# Patient Record
Sex: Male | Born: 1985 | Race: White | Hispanic: No | Marital: Single | Smoking: Current every day smoker
Health system: Southern US, Community
[De-identification: ages and names within clinical notes are randomized; demographics above are authoritative.]

## PROBLEM LIST (undated history)

## (undated) DIAGNOSIS — F909 Attention-deficit hyperactivity disorder, unspecified type: Secondary | ICD-10-CM

## (undated) DIAGNOSIS — A6000 Herpesviral infection of urogenital system, unspecified: Secondary | ICD-10-CM

## (undated) HISTORY — DX: Attention-deficit hyperactivity disorder, unspecified type: F90.9

## (undated) HISTORY — DX: Herpesviral infection of urogenital system, unspecified: A60.00

---
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
## 2005-09-19 ENCOUNTER — Ambulatory Visit: Payer: Self-pay | Admitting: Family Medicine

## 2006-11-23 ENCOUNTER — Ambulatory Visit: Payer: Self-pay | Admitting: Family Medicine

## 2009-12-13 ENCOUNTER — Ambulatory Visit: Payer: Self-pay | Admitting: Family Medicine
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
## 2011-03-06 ENCOUNTER — Encounter: Payer: Self-pay | Admitting: Internal Medicine

## 2011-03-07 ENCOUNTER — Encounter: Payer: Self-pay | Admitting: Family Medicine

## 2011-03-07 ENCOUNTER — Ambulatory Visit (INDEPENDENT_AMBULATORY_CARE_PROVIDER_SITE_OTHER): Payer: 59 | Admitting: Family Medicine

## 2011-03-07 VITALS — BP 130/90 | HR 71 | Wt 161.0 lb

## 2011-03-07 DIAGNOSIS — J309 Allergic rhinitis, unspecified: Secondary | ICD-10-CM

## 2011-03-07 DIAGNOSIS — Z23 Encounter for immunization: Secondary | ICD-10-CM

## 2011-03-07 DIAGNOSIS — M25579 Pain in unspecified ankle and joints of unspecified foot: Secondary | ICD-10-CM

## 2011-03-07 DIAGNOSIS — M25571 Pain in right ankle and joints of right foot: Secondary | ICD-10-CM

## 2011-03-07 MED ORDER — MOMETASONE FUROATE 50 MCG/ACT NA SUSP: 2.0000 | Freq: Every day | NASAL | Status: DC

## 2011-03-07 NOTE — Progress Notes (Signed)
  Subjective:    Patient ID: Sergio Marshall, male    DOB: 27-May-1985, 27 y.o.   MRN: [id number]  HPI He has a history of chronic ankle pain. Here is having difficulty with his right ankle in high school but does not remember any particular injury. Now he is standing for long periods of time and is noting lateral ankle discomfort. He also continues to have difficulty with his allergies mainly with nasal congestion and minimally with sneezing, itchy watery eyes, rhinorrhea. He states the Flonase has not been useful. He has also tried Zyrtec with minimal relief. He does smoke.   Review of Systems     Objective:   Physical Exam Alert and in no distress. Right foot exam shows slight laxity with anterior drawer testing. Good strength. Slight tenderness over the ATF area. X-ray shows no acute changes       Assessment & Plan:   1. Right ankle pain  DG Ankle Complete Right  2. Allergic rhinitis, mild     I will switch him to Nasonex. Will also have him do ankle strengthening exercises for the next several weeks and see how that does help his symptoms. If no improvement, he is to call me.

## 2011-03-07 NOTE — Patient Instructions (Signed)
Try the nasal spray and if that doesn't work, call me. I will call you with the results of the x-ray.

## 2012-07-10 ENCOUNTER — Encounter (HOSPITAL_COMMUNITY): Payer: Self-pay

## 2012-07-10 ENCOUNTER — Emergency Department (HOSPITAL_COMMUNITY): Payer: BC Managed Care – PPO

## 2012-07-10 ENCOUNTER — Emergency Department (HOSPITAL_COMMUNITY)
Admission: EM | Admit: 2012-07-10 | Discharge: 2012-07-10 | Disposition: A | Discharge status: Home / Self Care | Payer: BC Managed Care – PPO | Attending: Emergency Medicine | Admitting: Emergency Medicine

## 2012-07-10 DIAGNOSIS — Z8619 Personal history of other infectious and parasitic diseases: Secondary | ICD-10-CM | POA: Insufficient documentation

## 2012-07-10 DIAGNOSIS — Y939 Activity, unspecified: Secondary | ICD-10-CM | POA: Insufficient documentation

## 2012-07-10 DIAGNOSIS — F17200 Nicotine dependence, unspecified, uncomplicated: Secondary | ICD-10-CM | POA: Insufficient documentation

## 2012-07-10 DIAGNOSIS — X500XXA Overexertion from strenuous movement or load, initial encounter: Secondary | ICD-10-CM | POA: Insufficient documentation

## 2012-07-10 DIAGNOSIS — S93409A Sprain of unspecified ligament of unspecified ankle, initial encounter: Secondary | ICD-10-CM | POA: Insufficient documentation

## 2012-07-10 DIAGNOSIS — Y929 Unspecified place or not applicable: Secondary | ICD-10-CM | POA: Insufficient documentation

## 2012-07-10 DIAGNOSIS — S93402A Sprain of unspecified ligament of left ankle, initial encounter: Secondary | ICD-10-CM

## 2012-07-10 DIAGNOSIS — Z8659 Personal history of other mental and behavioral disorders: Secondary | ICD-10-CM | POA: Insufficient documentation

## 2012-07-10 MED ORDER — HYDROCODONE-ACETAMINOPHEN 5-325 MG PO TABS: 2.0000 | ORAL_TABLET | Freq: Four times a day (QID) | ORAL | Status: DC | PRN

## 2012-07-10 NOTE — ED Notes (Signed)
Patient was stepping off deck on the ground and stepped off wrong and rolled ankle. Iced ankle. Woke up this morning and unable to walk on it.

## 2012-07-10 NOTE — ED Provider Notes (Signed)
History     CSN: [id number]  Arrival date & time 07/10/12  9147   First MD Initiated Contact with Patient 07/10/12 [phone]      No chief complaint on file.   (Consider location/radiation/quality/duration/timing/severity/associated sxs/prior treatment) Patient is a 27 y.o. male presenting with ankle pain. The history is provided by the patient.  Ankle Pain Location:  Ankle Injury: yes   Mechanism of injury comment:  Stepped awkwardly off the deck and turned ankle. Ankle location:  L ankle Pain details:    Quality:  Sharp   Radiates to:  Does not radiate   Severity:  Moderate   Onset quality:  Sudden   Timing:  Constant   Progression:  Unchanged Chronicity:  New Prior injury to area:  No Relieved by:  NSAIDs Worsened by:  Activity and bearing weight   Past Medical History  Diagnosis Date  . ADHD (attention deficit hyperactivity disorder)   . Herpes genitalis     No past surgical history on file.  No family history on file.  History  Substance Use Topics  . Smoking status: Current Every Day Smoker  . Smokeless tobacco: Not on file  . Alcohol Use: Not on file      Review of Systems  All other systems reviewed and are negative.    Allergies  Review of patient's allergies indicates no known allergies.  Home Medications  No current outpatient prescriptions on file.  BP 133/69  Pulse 94  Temp(Src) 98 F (36.7 C) (Oral)  Resp 18  Ht 5\' 8"  (1.727 m)  Wt 161 lb (73.029 kg)  BMI 24.49 kg/m2  SpO2 99%  Physical Exam  Nursing note and vitals reviewed. Constitutional: He is oriented to person, place, and time. He appears well-developed and well-nourished. No distress.  HENT:  Head: Normocephalic and atraumatic.  Mouth/Throat: Oropharynx is clear and moist.  Neck: Normal range of motion. Neck supple.  Musculoskeletal:  The left ankle is noted to have swelling and slight ecchymosis over the lateral malleolus.  There is no 5th mt ttp or proximal fibular ttp.   Neurological: He is alert and oriented to person, place, and time.  Skin: Skin is warm and dry. He is not diaphoretic.    ED Course  Procedures (including critical care time)  Labs Reviewed - No data to display No results found.   No diagnosis found.    MDM  The xrays are negative.  Will treat as a sprain with rest, ice, elevation.  Follow up prn if not improving.        Geoffery Lyons, MD 07/10/12 [phone]

## 2013-02-28 ENCOUNTER — Encounter: Payer: Self-pay | Admitting: Family Medicine

## 2013-02-28 ENCOUNTER — Ambulatory Visit (INDEPENDENT_AMBULATORY_CARE_PROVIDER_SITE_OTHER): Payer: BC Managed Care – PPO | Admitting: Family Medicine

## 2013-02-28 VITALS — BP 130/87 | HR 87 | Temp 98.4°F | Ht 67.0 in | Wt 169.0 lb

## 2013-02-28 DIAGNOSIS — J069 Acute upper respiratory infection, unspecified: Secondary | ICD-10-CM

## 2013-02-28 DIAGNOSIS — B079 Viral wart, unspecified: Secondary | ICD-10-CM

## 2013-02-28 NOTE — Progress Notes (Signed)
   Subjective:    Patient ID: Sergio Marshall, male    DOB: 02-06-1986, 27 y.o.   MRN: [id number]  HPI He complains of a 4 day history started with nasal congestion,, slight sore throat and coughing no fever, chills or earache. He states he is feeling slightly better today. He also has a wart present on his left medial forearm. This does cause difficulty occasionally getting caught and bleeding.  Review of Systems     Objective:   Physical Exam alert and in no distress. Tympanic membranes and canals are normal. Throat is clear. Tonsils are normal. Neck is supple without adenopathy or thyromegaly. Cardiac exam shows a regular sinus rhythm without murmurs or gallops. Lungs are clear to auscultation. 1 cm raised lesion is noted on the left medial forearm.       Assessment & Plan:  Acute URI  Wart  supportive care for the URI since he is essentially getting better. The wart was frozen with verruca freeze. He will return here if further difficulty with the wart.

## 2014-02-11 IMAGING — CR DG ANKLE COMPLETE 3+V*L*
3 series · 3 of 3 positions shown · non-contrast
Comparison: None.

CLINICAL DATA: Fall with ankle twisting injury.

LEFT ANKLE COMPLETE - 3+ VIEW

[x ankle ap left]
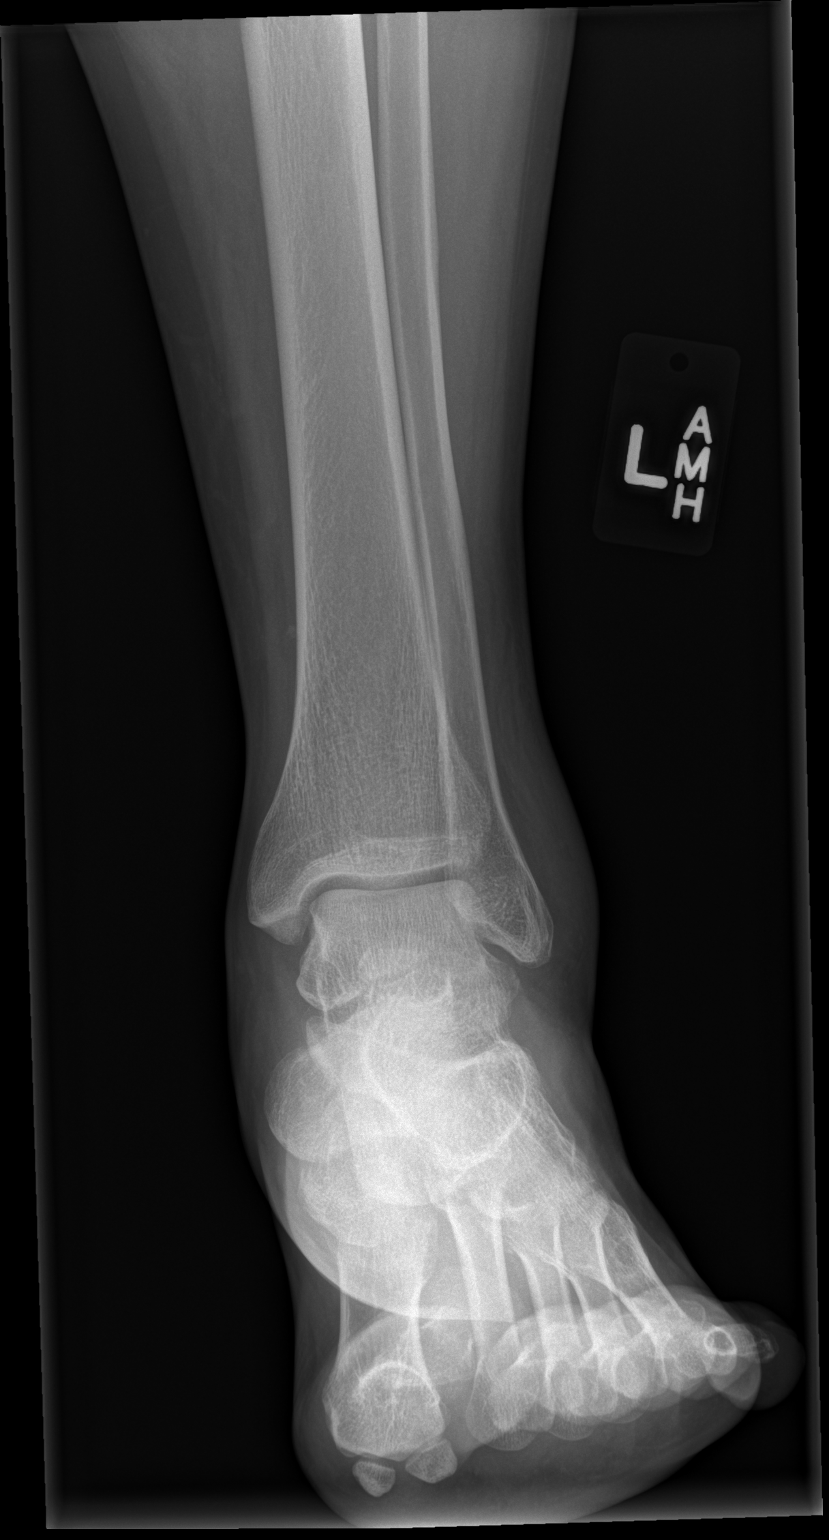

[x ankle obl left]
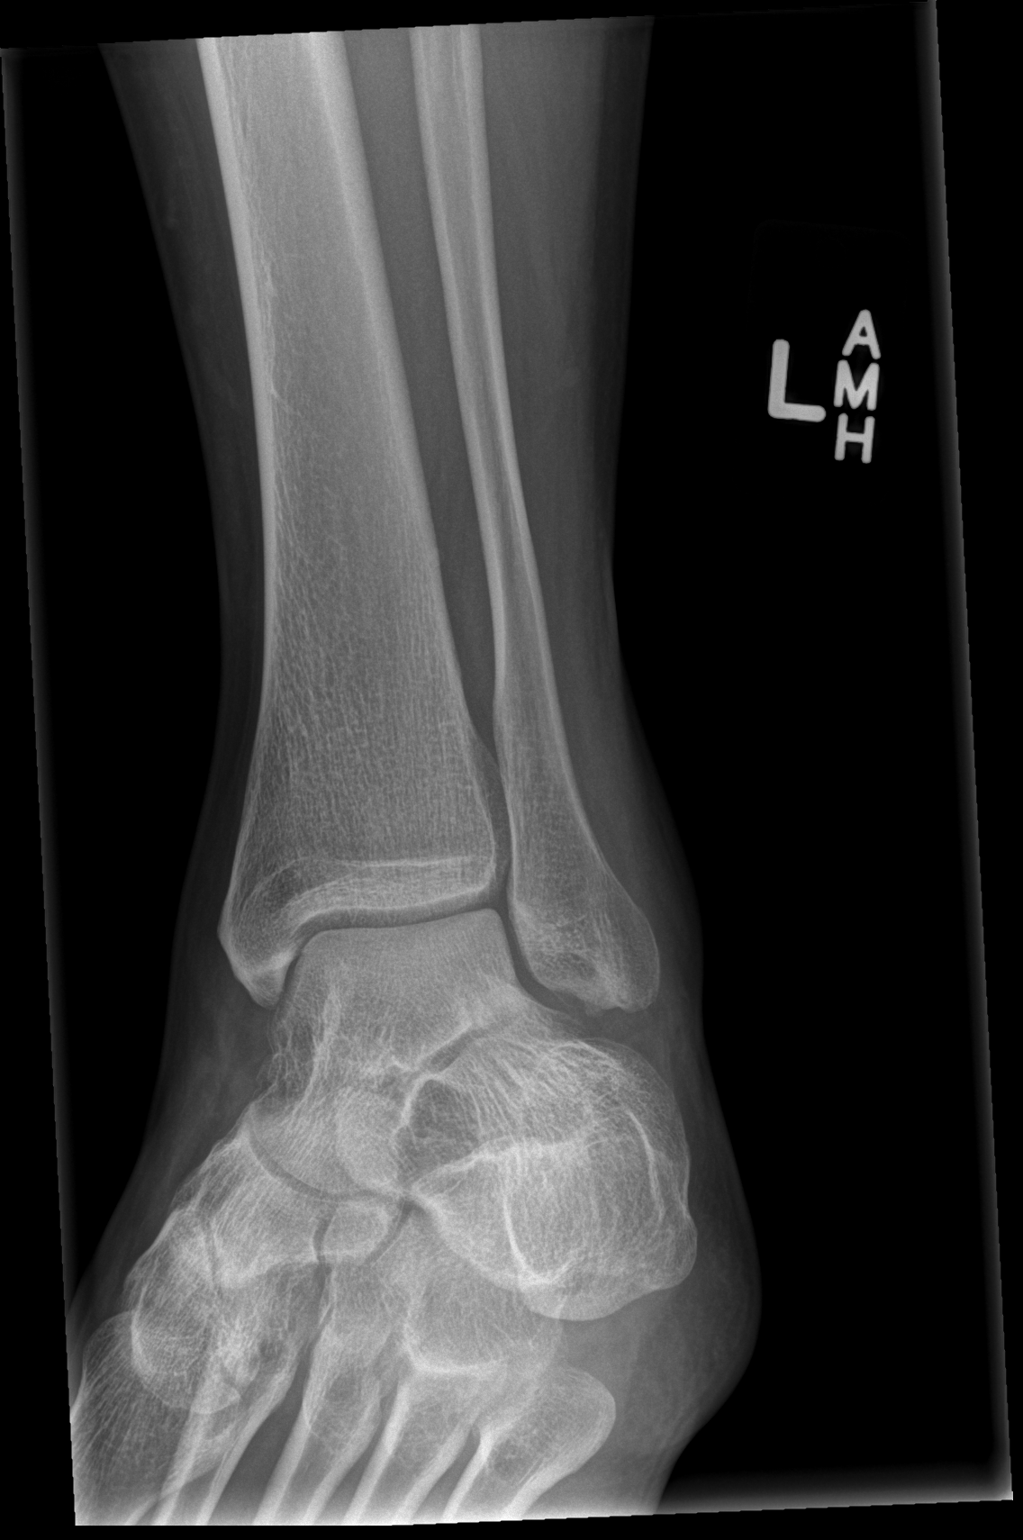

[x ankle lat left]
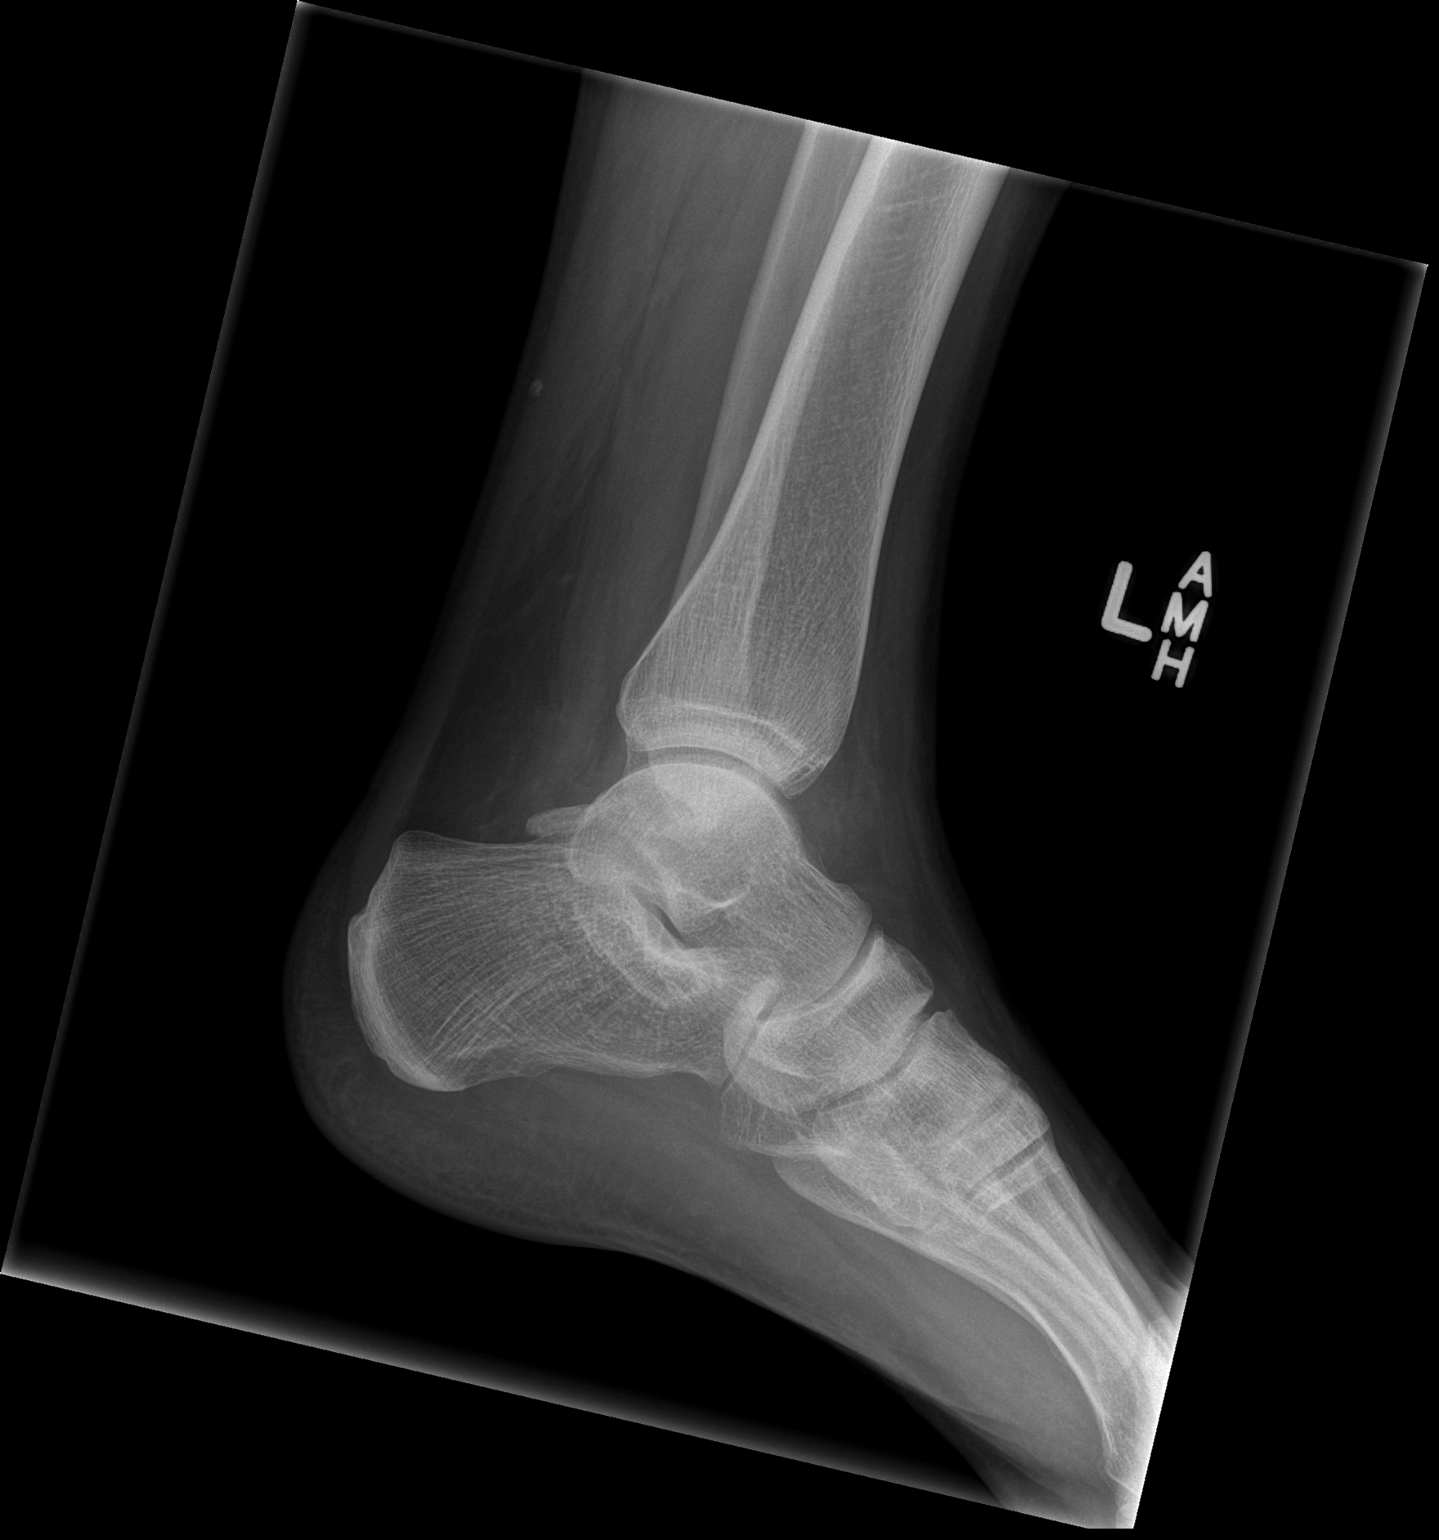

[3 of 3 positions shown; findings below may reference images not displayed]

FINDINGS: Abnormal soft tissue swelling overlies the lateral
malleolus.  No malleolar fracture observed.  Plafond and talar dome
intact.  Base of fifth metatarsal intact.

On the lateral projection there is a suggestion of a small
tibiotalar joint effusion.
IMPRESSION: 1.  Soft tissue swelling laterally and small tibiotalar joint
effusion.  An underlying fracture is not observed.

## 2014-09-19 ENCOUNTER — Ambulatory Visit (INDEPENDENT_AMBULATORY_CARE_PROVIDER_SITE_OTHER): Payer: Managed Care, Other (non HMO) | Admitting: Family Medicine

## 2014-09-19 ENCOUNTER — Encounter: Payer: Self-pay | Admitting: Family Medicine

## 2014-09-19 VITALS — BP 126/88 | HR 86 | Wt 173.0 lb

## 2014-09-19 DIAGNOSIS — F329 Major depressive disorder, single episode, unspecified: Secondary | ICD-10-CM

## 2014-09-19 DIAGNOSIS — F909 Attention-deficit hyperactivity disorder, unspecified type: Secondary | ICD-10-CM | POA: Diagnosis not present

## 2014-09-19 DIAGNOSIS — F32A Depression, unspecified: Secondary | ICD-10-CM

## 2014-09-19 DIAGNOSIS — F988 Other specified behavioral and emotional disorders with onset usually occurring in childhood and adolescence: Secondary | ICD-10-CM

## 2014-09-19 MED ORDER — BUPROPION HCL ER (SR) 150 MG PO TB12: 150.0000 mg | ORAL_TABLET | Freq: Two times a day (BID) | ORAL | Status: DC
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
## 2014-09-19 NOTE — Patient Instructions (Signed)
Call family and Children's services. [employer]. Check with [employer] [facility]. Start on the Wellbutrin in the morning for the first several days then twice a day. If there is trouble with sleeping let me know

## 2014-10-03 ENCOUNTER — Encounter: Payer: Self-pay | Admitting: Family Medicine

## 2014-10-03 ENCOUNTER — Ambulatory Visit (INDEPENDENT_AMBULATORY_CARE_PROVIDER_SITE_OTHER): Payer: Managed Care, Other (non HMO) | Admitting: Family Medicine

## 2014-10-03 VITALS — BP 118/78 | HR 70 | Wt 173.0 lb

## 2014-10-03 DIAGNOSIS — F32A Depression, unspecified: Secondary | ICD-10-CM

## 2014-10-03 DIAGNOSIS — F329 Major depressive disorder, single episode, unspecified: Secondary | ICD-10-CM

## 2014-10-03 MED ORDER — BUPROPION HCL ER (SR) 150 MG PO TB12
150.0000 mg | ORAL_TABLET | Freq: Two times a day (BID) | ORAL | Status: AC
Start: 2014-10-03 — End: ?

## 2014-10-03 NOTE — Patient Instructions (Addendum)
Write down your thoughts and get them out of  your mind. Take both medications in the morning. Limit your alcohol intake and don't use it to relax before you go to bed Call me in one month

## 2014-10-03 NOTE — Progress Notes (Signed)
   Subjective:    Patient ID: Sergio Marshall, male    DOB: 03/10/1986, 29 y.o.   MRN: [id number]  HPI He is here for recheck. He states he is doing much better overall but still having some difficulty with sleep and anxiety. He states that during the day he does tend to think a lot.   Review of Systems     Objective:   Physical Exam Alert and in no distress. Appropriate lead dressed with a good demeanor.       Assessment & Plan:  Depression - Plan: buPROPion (WELLBUTRIN SR) 150 MG 12 hr tablet  he is now on twice a day dosing but I did recommend he cut it to 2 pills in the morning to see if this will help with his sleep. Also discussed writing down everything that he is thinking about to get it out of his mind. He will then call in a month to let me know how he is doing. Presently he is not involved in counseling.

## 2021-04-08 ENCOUNTER — Telehealth: Payer: Self-pay

## 2021-04-08 NOTE — Telephone Encounter (Signed)
Called pt to see if still a pt of Dr. Redmond [employer]. No answer and VM is full
